# Patient Record
Sex: Female | Born: 2000 | Race: White | Hispanic: No | Marital: Single | Smoking: Never smoker
Health system: Southern US, Community
[De-identification: ages and names within clinical notes are randomized; demographics above are authoritative.]

## PROBLEM LIST (undated history)

## (undated) DIAGNOSIS — B084 Enteroviral vesicular stomatitis with exanthem: Secondary | ICD-10-CM

## (undated) DIAGNOSIS — Z23 Encounter for immunization: Secondary | ICD-10-CM

## (undated) HISTORY — DX: Enteroviral vesicular stomatitis with exanthem: B08.4

## (undated) HISTORY — PX: LIPOMA EXCISION: SHX5283

## (undated) HISTORY — DX: Encounter for immunization: Z23

## (undated) NOTE — *Deleted (*Deleted)
I value your feedback and entrusting us with your care. If you get a [facility] patient survey, I would appreciate you taking the time to let us know about your experience today. Thank you!  As of March 12, 2019, your lab results will be released to your MyChart immediately, before I even have a chance to see them. Please give me time to review them and contact you if there are any abnormalities. Thank you for your patience.  

---
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
## 2021-03-06 ENCOUNTER — Other Ambulatory Visit: Payer: Self-pay

## 2021-03-06 ENCOUNTER — Ambulatory Visit (INDEPENDENT_AMBULATORY_CARE_PROVIDER_SITE_OTHER): Payer: Medicaid Other

## 2021-03-06 DIAGNOSIS — Z3042 Encounter for surveillance of injectable contraceptive: Secondary | ICD-10-CM | POA: Diagnosis not present

## 2021-03-06 MED ORDER — MEDROXYPROGESTERONE ACETATE 150 MG/ML IM SUSP
150.0000 mg | Freq: Once | INTRAMUSCULAR | Status: AC
  Administered 2021-03-06: 150 mg via INTRAMUSCULAR

## 2021-06-20 ENCOUNTER — Telehealth: Payer: Self-pay | Admitting: Nurse Practitioner

## 2021-06-20 ENCOUNTER — Ambulatory Visit (INDEPENDENT_AMBULATORY_CARE_PROVIDER_SITE_OTHER): Payer: Medicaid Other | Admitting: Registered Nurse

## 2021-06-20 ENCOUNTER — Encounter: Payer: Self-pay | Admitting: Registered Nurse

## 2021-06-20 VITALS — BP 103/64 | HR 71 | Temp 98.1°F | Resp 18 | Ht 62.0 in | Wt 113.4 lb

## 2021-06-20 DIAGNOSIS — L989 Disorder of the skin and subcutaneous tissue, unspecified: Secondary | ICD-10-CM | POA: Diagnosis not present

## 2021-06-20 DIAGNOSIS — Z3042 Encounter for surveillance of injectable contraceptive: Secondary | ICD-10-CM

## 2021-06-20 LAB — POCT URINE PREGNANCY: Preg Test, Ur: NEGATIVE

## 2021-06-20 MED ORDER — TRIAMCINOLONE ACETONIDE 0.1 % EX CREA: 1.0000 "application " | TOPICAL_CREAM | Freq: Two times a day (BID) | CUTANEOUS | 0 refills | Status: DC

## 2021-06-20 MED ORDER — MEDROXYPROGESTERONE ACETATE 150 MG/ML IM SUSY
PREFILLED_SYRINGE | Freq: Once | INTRAMUSCULAR | Status: AC
  Administered 2021-06-20: 150 mg via INTRAMUSCULAR

## 2021-06-20 NOTE — Patient Instructions (Signed)
Ms. Erin Krause -  ? ?Great to meet you! ? ?Depo today. Next shot due between June 6 - June 20. ? ?See you for a physical in September. ? ?Call sooner with concerns ? ?Triamcinolone sent to pharmacy ? ?Thanks, ? ?Rich  ?

## 2021-06-20 NOTE — Telephone Encounter (Signed)
Patient not under care of [facility]. ? ?Please call patient and advise she can contact new PCP for this information. Thanks! ?

## 2021-06-20 NOTE — Telephone Encounter (Signed)
Patient called to request copy of immunization records for new job asap. ? ?Please advise at [phone].  ?

## 2021-06-20 NOTE — Progress Notes (Signed)
? ?New Patient Office Visit ? ?Subjective:  ?Patient ID: Erin Krause, female    DOB: June 23, 2000  Age: 21 y.o. MRN: [id number] ? ?CC:  ?Chief Complaint  ?Patient presents with  ? New Patient (Initial Visit)  ?  Patient states she is here to establish care and would like to get Depo. Patient states she wants skin tag looked at.  ? ? ?HPI ?Erin Krause presents for visit to est care, depo, skin tag ? ?Histories reviewed and updated with patient.  ? ?Has been on dmpa for some time ?Amenorrhea secondary to this ?Due in Feb, has not had upic since ?Would like to resume ?Tolerated well ?Brought inj from pharmacy today. ? ?Notes skin lesion behind L knee ?Ongoing for some time ?Recently became inflamed and itchy ?Has not treated ?Not growing or spreading ?No red streaks or distal swelling/redness ? ?Past Medical History:  ?Diagnosis Date  ? Hand, foot and mouth disease 12/01/2012  ? Vaccine for human papilloma virus (HPV) types 6, 11, 16, and 18 administered   ? ? ?Past Surgical History:  ?Procedure Laterality Date  ? LIPOMA EXCISION    ? ? ?Family History  ?Problem Relation Age of Onset  ? Heart attack Maternal Grandmother   ? Heart attack Maternal Grandfather   ? ? ?Social History  ? ?Socioeconomic History  ? Marital status: Single  ?  Spouse name: Not on file  ? Number of children: Not on file  ? Years of education: Not on file  ? Highest education level: Not on file  ?Occupational History  ? Not on file  ?Tobacco Use  ? Smoking status: Never  ? Smokeless tobacco: Never  ?Vaping Use  ? Vaping Use: Never used  ?Substance and Sexual Activity  ? Alcohol use: Never  ? Drug use: Never  ? Sexual activity: Yes  ?  Partners: Male  ?  Birth control/protection: Condom, Injection  ?Other Topics Concern  ? Not on file  ?Social History Narrative  ? Not on file  ? ?Social Determinants of Health  ? ?Financial Resource Strain: Not on file  ?Food Insecurity: Not on file  ?Transportation Needs: Not on file  ?Physical  Activity: Not on file  ?Stress: Not on file  ?Social Connections: Not on file  ?Intimate Partner Violence: Not on file  ? ? ?ROS ?Review of Systems  ?Constitutional: Negative.   ?HENT: Negative.    ?Eyes: Negative.   ?Respiratory: Negative.    ?Cardiovascular: Negative.   ?Gastrointestinal: Negative.   ?Genitourinary: Negative.   ?Musculoskeletal: Negative.   ?Skin: Negative.   ?Neurological: Negative.   ?Psychiatric/Behavioral: Negative.    ?All other systems reviewed and are negative. ? ?Objective:  ? ?Today's Vitals: BP 103/64   Pulse 71   Temp 98.1 ?F (36.7 ?C) (Temporal)   Resp 18   Ht '5\' 2"'$  (1.575 m)   Wt 113 lb 6.4 oz (51.4 kg)   SpO2 100%   BMI 20.74 kg/m?  ? ?Physical Exam ?Vitals and nursing note reviewed.  ?Constitutional:   ?   General: She is not in acute distress. ?   Appearance: Normal appearance. She is not ill-appearing, toxic-appearing or diaphoretic.  ?Cardiovascular:  ?   Rate and Rhythm: Normal rate and regular rhythm.  ?   Pulses: Normal pulses.  ?   Heart sounds: Normal heart sounds. No murmur heard. ?  No friction rub. No gallop.  ?Pulmonary:  ?   Effort: Pulmonary effort is normal. No respiratory distress.  ?  Breath sounds: Normal breath sounds. No stridor. No wheezing, rhonchi or rales.  ?Chest:  ?   Chest wall: No tenderness.  ?Skin: ?   General: Skin is warm and dry.  ?   Capillary Refill: Capillary refill takes less than 2 seconds.  ?   Findings: Lesion (behind L knee. mild swelling, raised 1-61m. some redness. nontender. excoriated in center.) present.  ?Neurological:  ?   General: No focal deficit present.  ?   Mental Status: She is alert and oriented to person, place, and time. Mental status is at baseline.  ?Psychiatric:     ?   Mood and Affect: Mood normal.     ?   Behavior: Behavior normal.     ?   Thought Content: Thought content normal.     ?   Judgment: Judgment normal.  ? ? ?Assessment & Plan:  ? ?Problem List Items Addressed This Visit   ?None ?Visit Diagnoses   ? ?  Encounter for surveillance of injectable contraceptive    -  Primary  ? Relevant Orders  ? POCT urine pregnancy (Completed)  ? Skin lesion of left leg      ? Relevant Medications  ? triamcinolone cream (KENALOG) 0.1 %  ? ?  ? ? ?Outpatient Encounter Medications as of 06/20/2021  ?Medication Sig  ? medroxyPROGESTERone Acetate 150 MG/ML SUSY Inject 1 mL (150 mg total) into the muscle every 3 (three) months.  ? triamcinolone cream (KENALOG) 0.1 % Apply 1 application. topically 2 (two) times daily.  ? clindamycin (CLEOCIN T) 1 % external solution Apply small amount to entire face every morning. Need appt for further refills (Patient not taking: Reported on 06/20/2021)  ? DIFFERIN 0.3 % gel APPLY 1 A SMALL AMOUNT TO AFFECTED AREA EVERY EVENING (Patient not taking: Reported on 06/20/2021)  ? ?No facility-administered encounter medications on file as of 06/20/2021.  ? ? ?Follow-up: Return in about 3 months (around 09/20/2021) for Depo June 6-20, CPE in Sept.  ? ?PLAN ?Negative pregnancy test today. No upic since last dmpa was due. Will resume today. Advised condoms or abstinence for one week after injection. Return for next shot in window. ?Triamcinolone for skin lesion. Unclear etiology. Will continue to monitor as it heals up.  ?Patient encouraged to call clinic with any questions, comments, or concerns. ? ?RMaximiano Coss NP ? ?

## 2021-06-21 ENCOUNTER — Telehealth: Payer: Self-pay

## 2021-06-21 NOTE — Telephone Encounter (Signed)
Patient's PCP was changed to Maximiano Coss after I took the message. Outbound call placed to notify patient; she will contact her new PCP for records. ? ? ?

## 2021-06-21 NOTE — Telephone Encounter (Signed)
Faxed

## 2021-06-21 NOTE — Telephone Encounter (Signed)
Pt is wanting shot records for job faxed to [phone]  ?

## 2021-09-25 ENCOUNTER — Encounter: Payer: Self-pay | Admitting: Family Medicine

## 2021-09-25 ENCOUNTER — Other Ambulatory Visit: Payer: Self-pay

## 2021-09-25 ENCOUNTER — Ambulatory Visit (INDEPENDENT_AMBULATORY_CARE_PROVIDER_SITE_OTHER): Payer: Medicaid Other | Admitting: Family Medicine

## 2021-09-25 VITALS — HR 76 | Temp 97.6°F | Resp 18 | Ht 63.0 in | Wt 110.4 lb

## 2021-09-25 DIAGNOSIS — N898 Other specified noninflammatory disorders of vagina: Secondary | ICD-10-CM

## 2021-10-24 ENCOUNTER — Ambulatory Visit: Payer: Medicaid Other | Admitting: Dermatology

## 2021-10-25 ENCOUNTER — Ambulatory Visit (INDEPENDENT_AMBULATORY_CARE_PROVIDER_SITE_OTHER): Payer: Medicaid Other | Admitting: Dermatology

## 2021-10-25 DIAGNOSIS — L732 Hidradenitis suppurativa: Secondary | ICD-10-CM | POA: Diagnosis not present

## 2021-10-25 DIAGNOSIS — L720 Epidermal cyst: Secondary | ICD-10-CM | POA: Diagnosis not present

## 2021-10-25 DIAGNOSIS — B070 Plantar wart: Secondary | ICD-10-CM

## 2021-10-25 MED ORDER — CLINDAMYCIN PHOSPHATE 1 % EX LOTN: TOPICAL_LOTION | Freq: Every day | CUTANEOUS | 5 refills | Status: AC

## 2021-10-25 NOTE — Progress Notes (Signed)
Follow-Up Visit   Subjective  Erin Krause is a 21 y.o. female who presents for the following: Warts (Mother and patient report possible wart at left foot. ) and Skin Problem (Patient reports possible cyst in right vaginal area. Has been seen by primary care. PCP drained but told patient if cyst came back to see dermatology. ).  The following portions of the chart were reviewed this encounter and updated as appropriate:  Tobacco  Allergies  Meds  Problems  Med Hx  Surg Hx  Fam Hx      Review of Systems: No other skin or systemic complaints except as noted in HPI or Assessment and Plan.   Objective  Well appearing patient in no apparent distress; mood and affect are within normal limits.  A focused examination was performed including left foot, right labia majora . Relevant physical exam findings are noted in the Assessment and Plan.  right labia majora Small tender erythematous fluctuant linear plaque. Multiple double open comedones and small scars at labia majora   left plantar foot x 1 Verrucous papules -- Discussed viral etiology and contagion.   Right Labium Majus Small tender erythematous fluctuant linear plaque.    Assessment & Plan  Hidradenitis suppurativa right labia majora  Chronic and persistent condition with duration or expected duration over one year. Condition is bothersome/symptomatic for patient. Currently flared.  Hidradenitis Suppurativa is a chronic; persistent; non-curable, but treatable condition due to abnormal inflamed sweat glands in the body folds (axilla, inframammary, groin, medial thighs), causing recurrent painful draining cysts and scarring. It can be associated with severe scarring acne and cysts; also abscesses and scarring of scalp. The goal is control and prevention of flares, as it is not curable. Scars are permanent and can be thickened. Treatment may include daily use of topical medication and oral antibiotics.  Oral isotretinoin  may also be helpful.  For more severe cases, Humira (a biologic injection) may be prescribed to decrease the inflammatory process and prevent flares.  When indicated, inflamed cysts may also be treated surgically.  Recommend Start clindamycin solution in the am to the areas where you tend to get bumps. Avoid getting in the vagina.  Recommend washing the area in the shower once a day with Benzoyl Peroxide wash such as PanOyxl 4% creamy wash or hibiclens (chlorhexidine) wash. Avoid getting either of the washes in the vaginal area as they can cause yeast infections.   Benzoyl peroxide can cause dryness and irritation of the skin. It can also bleach fabric.   If cyst still bothering recommend HS surgery    clindamycin (CLEOCIN-T) 1 % lotion - right labia majora Apply topically daily. To areas where you tend to get bumps  Anaerobic and Aerobic Culture - right labia majora  Plantar wart left plantar foot x 1  Discussed viral etiology and risk of spread.  Discussed multiple treatments may be required to clear warts.  Discussed possible post-treatment dyspigmentation and risk of recurrence.  Prior to procedure, discussed risks of blister formation, small wound, skin dyspigmentation, or rare scar following cryotherapy. Recommend Vaseline ointment to treated areas while healing.  Squaric Acid 3% applied to warts today. Prior to application reviewed risk of inflammation and irritation.  Cantharidin Plus is a blistering agent that comes from a beetle.  It needs to be washed off in about 4 hours after application.  Although it is painless when applied in office, it may cause symptoms of mild pain and burning several hours later.  Treated areas will swell and turn red, and blisters may form.  Vaseline and a bandaid may be applied until wound has healed.  Once healed, the skin may remain temporarily discolored.  It can take weeks to months for pigmentation to return to normal.  Advised to wash off with  soap and water in 4 hours or sooner if it becomes tender before then.   Destruction of lesion - left plantar foot x 1  Destruction method: cryotherapy   Informed consent: discussed and consent obtained   Lesion destroyed using liquid nitrogen: Yes   Cryotherapy cycles:  2 Outcome: patient tolerated procedure well with no complications   Post-procedure details: wound care instructions given    Destruction of lesion - left plantar foot x 1  Destruction method: chemical removal   Informed consent: discussed and consent obtained   Timeout:  patient name, date of birth, surgical site, and procedure verified Chemical destruction method: cantharidin   Chemical destruction method comment:  Plus Application time:  4 hours Procedure instructions: patient instructed to wash and dry area   Outcome: patient tolerated procedure well with no complications   Post-procedure details: wound care instructions given    Epidermal inclusion cyst Right Labium Majus  Favor secondary to HS Culture today to r/o infection ILK injection done today NDC 6384-5364-68  Kenalog '10mg'$ /cc  May consider HS surgery if continues to be symptomatic despite injections  Intralesional injection - Right Labium Majus Location: right labia majora   Informed Consent: Discussed risks (infection, pain, bleeding, bruising, thinning of the skin, loss of skin pigment, lack of resolution, and recurrence of lesion) and benefits of the procedure, as well as the alternatives. Informed consent was obtained. Preparation: The area was prepared a standard fashion.  Procedure Details: An intralesional injection was performed with Kenalog 10 mg/cc. 0.15 cc in total were injected.  Total number of injections:2  Plan: The patient was instructed on post-op care. Recommend OTC analgesia as needed for pain.   Return for 4 - 8 week follow up wart and HS . I, Ruthell Rummage, CMA, am acting as scribe for [name],  MD.  Documentation: I have reviewed the above documentation for accuracy and completeness, and I agree with the above.  [name], MD

## 2021-10-25 NOTE — Patient Instructions (Addendum)
Hidradenitis Suppurativa is a chronic; persistent; non-curable, but treatable condition due to abnormal inflamed sweat glands in the body folds (axilla, inframammary, groin, medial thighs), causing recurrent painful draining cysts and scarring. It can be associated with severe scarring acne and cysts; also abscesses and scarring of scalp. The goal is control and prevention of flares, as it is not curable. Scars are permanent and can be thickened. Treatment may include daily use of topical medication and oral antibiotics.  Oral isotretinoin may also be helpful.  When indicated, inflamed cysts may also be treated surgically.  Recommend Start clindamycin solution in the am to the areas where you tend to get bumps. Avoid getting in the vagina.  Recommend washing the area in the shower once a day with Benzoyl Peroxide wash such as PanOyxl 4% creamy wash or hibiclens (chlorhexidine) wash. Avoid getting either of the washes in the vaginal area as they can cause yeast infections.   Benzoyl peroxide can cause dryness and irritation of the skin. It can also bleach fabric.    Cantharidin Plus is a blistering agent that comes from a beetle.  It needs to be washed off in about 4 hours after application.  Although it is painless when applied in office, it may cause symptoms of mild pain and burning several hours later.  Treated areas will swell and turn red, and blisters may form.  Vaseline and a bandaid may be applied until wound has healed.  Once healed, the skin may remain temporarily discolored.  It can take weeks to months for pigmentation to return to normal.  Advised to wash off with soap and water in 4 hours or sooner if it becomes tender before then.   Intralesional steroid injection side effects were reviewed including thinning of the skin and discoloration, such as redness, lightening or darkening.   Recommend taking Heliocare sun protection supplement daily in sunny weather for additional sun protection.  For maximum protection on the sunniest days, you can take up to 2 capsules of regular Heliocare OR take 1 capsule of Heliocare Ultra. For prolonged exposure (such as a full day in the sun), you can repeat your dose of the supplement 4 hours after your first dose. Heliocare can be purchased at [employer], at some [facility] or at [email or web address].     Some Self-Tanner Suggestions include - Sublime wipes by [place]  Melanoma is the most dangerous type of skin cancer, and is the leading cause of death from skin disease.  You are more likely to develop melanoma if you: Have light-colored skin, light-colored eyes, or red or blond hair Spend a lot of time in the sun Tan regularly, either outdoors or in a tanning bed Have had blistering sunburns, especially during childhood Have a close family member who has had a melanoma Have atypical moles or large birthmarks  Early detection of melanoma is key since treatment is typically straightforward and cure rates are extremely high if we catch it early.   The first sign of melanoma is often a change in a mole or a new dark spot.  The ABCDE system is a way of remembering the signs of melanoma.  A for asymmetry:  The two halves do not match. B for border:  The edges of the growth are irregular. C for color:  A mixture of colors are present instead of an even brown color. D for diameter:  Melanomas are usually (but not always) greater than  48m - the size of a pencil eraser. E for evolution:  The spot keeps changing in size, shape, and color.  Please check your skin once per month between visits. You can use a small mirror in front and a large mirror behind you to keep an eye on the back side or your body.   If you see any new or changing lesions before your next follow-up, please call to schedule a visit.  Please continue daily skin protection including broad spectrum sunscreen SPF 30+ to  sun-exposed areas, reapplying every 2 hours as needed when you're outdoors.         Due to recent changes in healthcare laws, you may see results of your pathology and/or laboratory studies on MyChart before the doctors have had a chance to review them. We understand that in some cases there may be results that are confusing or concerning to you. Please understand that not all results are received at the same time and often the doctors may need to interpret multiple results in order to provide you with the best plan of care or course of treatment. Therefore, we ask that you please give u[place]2 business days to thoroughly review all your results before contacting the office for clarification. Should we see a critical lab result, you will be contacted sooner.   If You Need Anything After Your Visit  If you have any questions or concerns for your doctor, please call our main line at [phone] press option 4 to reach your doctor's medical assistant. If no one answers, please leave a voicemail as directed and we will return your call as soon as possible. Messages left after 4 pm will be answered the following business day.   You may also send [place] message via [place] We typically respond to MyChart messages within 1-2 business days.  For prescription refills, please ask your pharmacy to contact our office. Our fax number is [phone]  If you have an urgent issue when the clinic is closed that cannot wait until the next business day, you can page your doctor at the number below.    Please note that while we do our best to be available for urgent issues outside of office hours, we are not available 24/7.   If you have an urgent issue and are unable to reach [place] you may choose to seek medical care at your doctor's office, retail clinic, urgent care center, or emergency room.  If you have a medical emergency, please immediately call 911 or go to the emergency department.  Pager Numbers  -  Dr. KNehemiah Massed [phone] - Dr. MLaurence Ferrari [phone] - Dr. SNicole Kindred [phone] In the event of inclement weather, please call our main line at [phone] an update on the status of any delays or closures.  Dermatology Medication Tips: Please keep the boxes that topical medications come in in order to help keep track of the instructions about where and how to use these. Pharmacies typically print the medication instructions only on the boxes and not directly on the medication tubes.   If your medication is too expensive, please contact our office at [phone] 4 or send [place] message through [place]   We are unable to tell what your co-pay for medications will be in advance as this is different depending on your insurance coverage. However, we may be able to find a substitute medication at lower cost or fill out paperwork to get insurance to cover a  needed medication.   If a prior authorization is required to get your medication covered by your insurance company, please allow [place] 1-2 business days to complete this process.  Drug prices often vary depending on where the prescription is filled and some pharmacies may offer cheaper prices.  The website www.goodrx.com contains coupons for medications through different pharmacies. The prices here do not account for what the cost may be with help from insurance (it may be cheaper with your insurance), but the website can give you the price if you did not use any insurance.  - You can print the associated coupon and take it with your prescription to the pharmacy.  - You may also stop by our office during regular business hours and pick up a GoodRx coupon card.  - If you need your prescription sent electronically to a different pharmacy, notify our office through [facility] or by phone at [phone] option 4.     Si Usted Necesita Algo Despus de Su Visita  Tambin puede enviarnos un mensaje a travs de Pharmacist, community. Por  lo general respondemos a los mensajes de MyChart en el transcurso de 1 a 2 das hbiles.  Para renovar recetas, por favor pida a su farmacia que se ponga en contacto con nuestra oficina. Harland Dingwall de fax es [place] [phone].  Si tiene un asunto urgente cuando la clnica est cerrada y que no puede esperar hasta el siguiente da hbil, puede llamar/localizar a su doctor(a) al nmero que aparece a continuacin.   Por favor, tenga en cuenta que aunque hacemos todo lo posible para estar disponibles para asuntos urgentes fuera del horario de [place], no estamos disponibles las 24 horas del da, los 7 das de la [place].   Si tiene un problema urgente y no puede comunicarse con nosotros, puede optar por buscar atencin mdica  en el consultorio de su doctor(a), en una clnica privada, en un centro de atencin urgente o en una sala de emergencias.  Si tiene Engineering geologist, por favor llame inmediatamente al 911 o vaya a la sala de emergencias.  Nmeros de bper  - Dr. Nehemiah Massed: [phone]  - Dra. Moye: [phone]  - Dra. Nicole Kindred: [phone]  En caso de inclemencias del [place], por favor llame a Johnsie Kindred principal al [phone] para una actualizacin sobre el [place] de cualquier retraso o cierre.  Consejos para la medicacin en dermatologa: Por favor, guarde las cajas en las que vienen los medicamentos de uso tpico para ayudarle a seguir las instrucciones sobre dnde y cmo usarlos. Las farmacias generalmente imprimen las instrucciones del medicamento slo en las cajas y no directamente en los tubos del [place].   Si su medicamento es muy caro, por favor, pngase en contacto con Zigmund Daniel llamando al [phone] y presione la opcin 4 o envenos un mensaje a travs de Pharmacist, community.   No podemos decirle cul ser su copago por los medicamentos por adelantado ya que esto es diferente dependiendo de la cobertura de su seguro. Sin embargo, es posible que podamos encontrar  un medicamento sustituto a Electrical engineer un formulario para que el seguro cubra el medicamento que se considera necesario.   Si se requiere una autorizacin previa para que su compaa de seguros [place] su medicamento, por favor permtanos de 1 a 2 das hbiles para completar este proceso.  Los precios de los medicamentos varan con frecuencia dependiendo del Environmental consultant de dnde se surte la receta y alguna farmacias pueden ofrecer precios ms baratos.  El sitio web www.goodrx.com tiene cupones para medicamentos de Airline pilot. Los precios aqu no tienen en cuenta lo que podra costar con la ayuda del seguro (puede ser ms barato con su seguro), pero el sitio web puede darle el precio si no utiliz Research scientist (physical sciences).  - Puede imprimir el cupn correspondiente y llevarlo con su receta a la farmacia.  - Tambin puede pasar por nuestra oficina durante el horario de atencin regular y Charity fundraiser una tarjeta de cupones de GoodRx.  - Si necesita que su receta se enve electrnicamente a una farmacia diferente, informe a nuestra oficina a travs de MyChart de [place] o por telfono llamando al [phone] y presione la opcin 4.

## 2021-10-30 ENCOUNTER — Encounter: Payer: Self-pay | Admitting: Dermatology

## 2021-11-02 ENCOUNTER — Telehealth: Payer: Self-pay

## 2021-11-02 LAB — ANAEROBIC AND AEROBIC CULTURE

## 2021-11-02 NOTE — Telephone Encounter (Signed)
-----   Message from Alfonso Patten, MD sent at 11/02/2021 10:09 AM EDT ----- Normal bacteria for this area. No antibiotics needed. Please check on how she is doing since the drainage. Thank you!  MAs please call. Thank you!

## 2021-11-02 NOTE — Telephone Encounter (Signed)
Patient's mother advised culture showed normal bacteria, no antibiotics needed. Patient's mother said that the cyst has come back but patient is using clindamycin solution and has follow up appt to recheck.  Lurlean Horns., RMA

## 2021-11-21 ENCOUNTER — Encounter: Payer: Self-pay | Admitting: Dermatology

## 2021-11-21 ENCOUNTER — Ambulatory Visit (INDEPENDENT_AMBULATORY_CARE_PROVIDER_SITE_OTHER): Payer: Medicaid Other | Admitting: Dermatology

## 2021-11-21 DIAGNOSIS — B070 Plantar wart: Secondary | ICD-10-CM

## 2021-11-21 DIAGNOSIS — L732 Hidradenitis suppurativa: Secondary | ICD-10-CM | POA: Diagnosis not present

## 2021-11-21 NOTE — Patient Instructions (Signed)
Cryotherapy Aftercare  Wash gently with soap and water everyday.   Apply Vaseline and Band-Aid daily until healed.    Instructions for After In-Office Application of Cantharidin  1. This is a strong medicine; please follow ALL instructions.  2. Gently wash off with soap and water in four hours or sooner s directed by your physician.  3. **WARNING** this medicine can cause severe blistering, blood blisters, infection, and/or scarring if it is not washed off as directed.  4. Your progress will be rechecked in 1-2 months; call sooner if there are any questions or problems.   Cantharidin is a blistering agent that comes from a beetle.  It needs to be washed off in about 4 hours after application.  Although it is painless when applied in office, it may cause symptoms of mild pain and burning several hours later.  Treated areas will swell and turn red, and blisters may form.  Vaseline and a bandaid may be applied until wound has healed.  Once healed, the skin may remain temporarily discolored.  It can take weeks to months for pigmentation to return to normal.  The molluscum may resolve with this topical treatment, but often, additional treatments may be required to clear molluscum.  It is recommended to keep the skin well-moisturized and avoid scratching affected area to help prevent spread of the molluscum.    Due to recent changes in healthcare laws, you may see results of your pathology and/or laboratory studies on MyChart before the doctors have had a chance to review them. We understand that in some cases there may be results that are confusing or concerning to you. Please understand that not all results are received at the same time and often the doctors may need to interpret multiple results in order to provide you with the best plan of care or course of treatment. Therefore, we ask that you please give [place] 2 business days to thoroughly review all your results before contacting the office for  clarification. Should we see a critical lab result, you will be contacted sooner.   If You Need Anything After Your Visit  If you have any questions or concerns for your doctor, please call our main line at [phone] and press option 4 to reach your doctor's medical assistant. If no one answers, please leave a voicemail as directed and we will return your call as soon as possible. Messages left after 4 pm will be answered the following business day.   You may also send [place] a message via [place]. We typically respond to MyChart messages within 1-2 business days.  For prescription refills, please ask your pharmacy to contact our office. Our fax number is [phone].  If you have an urgent issue when the clinic is closed that cannot wait until the next business day, you can page your doctor at the number below.    Please note that while we do our best to be available for urgent issues outside of office hours, we are not available 24/7.   If you have an urgent issue and are unable to reach [place], you may choose to seek medical care at your doctor's office, retail clinic, urgent care center, or emergency room.  If you have a medical emergency, please immediately call 911 or go to the emergency department.  Pager Numbers  - Dr. Nehemiah Massed: [phone]  - Dr. Laurence Ferrari: [phone]  - Dr. Nicole Kindred: [phone]  In the event of inclement weather, please call our main line at [phone] for an  update on the status of any delays or closures.  Dermatology Medication Tips: Please keep the boxes that topical medications come in in order to help keep track of the instructions about where and how to use these. Pharmacies typically print the medication instructions only on the boxes and not directly on the medication tubes.   If your medication is too expensive, please contact our office at [phone] option 4 or send [place] a message through [place].   We are unable to tell what your co-pay for  medications will be in advance as this is different depending on your insurance coverage. However, we may be able to find a substitute medication at lower cost or fill out paperwork to get insurance to cover a needed medication.   If a prior authorization is required to get your medication covered by your insurance company, please allow [place] 1-2 business days to complete this process.  Drug prices often vary depending on where the prescription is filled and some pharmacies may offer cheaper prices.  The website www.goodrx.com contains coupons for medications through different pharmacies. The prices here do not account for what the cost may be with help from insurance (it may be cheaper with your insurance), but the website can give you the price if you did not use any insurance.  - You can print the associated coupon and take it with your prescription to the pharmacy.  - You may also stop by our office during regular business hours and pick up a GoodRx coupon card.  - If you need your prescription sent electronically to a different pharmacy, notify our office through [facility] or by phone at [phone] option 4.     Si Usted Necesita Algo Despus de Su Visita  Tambin puede enviarnos un mensaje a travs de Pharmacist, community. Por lo general respondemos a los mensajes de MyChart en el transcurso de 1 a 2 das hbiles.  Para renovar recetas, por favor pida a su farmacia que se ponga en contacto con nuestra oficina. Harland Dingwall de fax es [place] [phone].  Si tiene un asunto urgente cuando la clnica est cerrada y que no puede esperar hasta el siguiente da hbil, puede llamar/localizar a su doctor(a) al nmero que aparece a continuacin.   Por favor, tenga en cuenta que aunque hacemos todo lo posible para estar disponibles para asuntos urgentes fuera del horario de [place], no estamos disponibles las 24 horas del da, los 7 das de la [place].   Si tiene un problema urgente y no puede  comunicarse con nosotros, puede optar por buscar atencin mdica  en el consultorio de su doctor(a), en una clnica privada, en un centro de atencin urgente o en una sala de emergencias.  Si tiene Engineering geologist, por favor llame inmediatamente al 911 o vaya a la sala de emergencias.  Nmeros de bper  - Dr. Nehemiah Massed: [phone]  - Dra. Moye: [phone]  - Dra. Nicole Kindred: [phone]  En caso de inclemencias del [place], por favor llame a Johnsie Kindred principal al [phone] para una actualizacin sobre el [place] de cualquier retraso o cierre.  Consejos para la medicacin en dermatologa: Por favor, guarde las cajas en las que vienen los medicamentos de uso tpico para ayudarle a seguir las instrucciones sobre dnde y cmo usarlos. Las farmacias generalmente imprimen las instrucciones del medicamento slo en las cajas y no directamente en los tubos del [place].   Si su medicamento es [employer], por favor, pngase en contacto con nuestra oficina llamando  al [phone] y presione la opcin 4 o envenos un mensaje a travs de Pharmacist, community.   No podemos decirle cul ser su copago por los medicamentos por adelantado ya que esto es diferente dependiendo de la cobertura de su seguro. Sin embargo, es posible que podamos encontrar un medicamento sustituto a Electrical engineer un formulario para que el seguro cubra el medicamento que se considera necesario.   Si se requiere una autorizacin previa para que su compaa de seguros [place] su medicamento, por favor permtanos de 1 a 2 das hbiles para completar este proceso.  Los precios de los medicamentos varan con frecuencia dependiendo del Environmental consultant de dnde se surte la receta y alguna farmacias pueden ofrecer precios ms baratos.  El sitio web www.goodrx.com tiene cupones para medicamentos de Airline pilot. Los precios aqu no tienen en cuenta lo que podra costar con la ayuda del seguro (puede ser ms barato con su seguro), pero  el sitio web puede darle el precio si no utiliz Research scientist (physical sciences).  - Puede imprimir el cupn correspondiente y llevarlo con su receta a la farmacia.  - Tambin puede pasar por nuestra oficina durante el horario de atencin regular y Charity fundraiser una tarjeta de cupones de GoodRx.  - Si necesita que su receta se enve electrnicamente a una farmacia diferente, informe a nuestra oficina a travs de MyChart de [place] o por telfono llamando al [phone] y presione la opcin 4.

## 2021-11-21 NOTE — Progress Notes (Signed)
Follow-Up Visit   Subjective  Erin Krause is a 21 y.o. female who presents for the following: Warts (Left plantar foot. Tx with squaric acid, cantharone plus and cryotherapy at last visit. Patient thinks area is smaller) and Cyst (Recheck vaginal area. Hx of cyst, injected with ILK at last visit. Has been using Clindamycin solution and BPO wash).  The following portions of the chart were reviewed this encounter and updated as appropriate:  Tobacco  Allergies  Meds  Problems  Med Hx  Surg Hx  Fam Hx      Review of Systems: No other skin or systemic complaints except as noted in HPI or Assessment and Plan.   Objective  Well appearing patient in no apparent distress; mood and affect are within normal limits.  A focused examination was performed including groin, left foot. Relevant physical exam findings are noted in the Assessment and Plan.  left plantar foot x1 Verrucous papules -- Discussed viral etiology and contagion.    Assessment & Plan  Plantar wart left plantar foot x1  Discussed viral etiology and risk of spread.  Discussed multiple treatments may be required to clear warts.  Discussed possible post-treatment dyspigmentation and risk of recurrence.  Cantharidin Plus is a blistering agent that comes from a beetle.  It needs to be washed off in about 4 hours after application.  Although it is painless when applied in office, it may cause symptoms of mild pain and burning several hours later.  Treated areas will swell and turn red, and blisters may form.  Vaseline and a bandaid may be applied until wound has healed.  Once healed, the skin may remain temporarily discolored.  It can take weeks to months for pigmentation to return to normal.  Advised to wash off with soap and water in 4 hours or sooner if it becomes tender before then.   Destruction of lesion - left plantar foot x1  Destruction method: cryotherapy   Informed consent: discussed and consent obtained    Lesion destroyed using liquid nitrogen: Yes   Outcome: patient tolerated procedure well with no complications   Post-procedure details: wound care instructions given   Additional details:  Prior to procedure, discussed risks of blister formation, small wound, skin dyspigmentation, or rare scar following cryotherapy. Recommend Vaseline ointment to treated areas while healing.   Destruction of lesion - left plantar foot x1  Destruction method: chemical removal   Informed consent: discussed and consent obtained   Timeout:  patient name, date of birth, surgical site, and procedure verified Chemical destruction method comment:  Cantharidin plus Procedure instructions: patient instructed to wash and dry area   Procedure instructions comment:  Wash off in 4-6 hours Outcome: patient tolerated procedure well with no complications   Post-procedure details: wound care instructions given   Additional details:  Squaric Acid 3% applied to warts today. Prior to application reviewed risk of inflammation and irritation.   Hidradenitis suppurativa Pubic  Chronic condition with duration or expected duration over one year. Currently well-controlled.    Continue Clindamycin lotion as directed.  Use Benzoyl peroxide cleanser as directed.   Related Medications clindamycin (CLEOCIN-T) 1 % lotion Apply topically daily. To areas where you tend to get bumps   Return in about 4 weeks (around 12/19/2021) for Wart Follow UP.  I, Erin Krause, CMA, am acting as scribe for Erin Gleason, MD.  Documentation: I have reviewed the above documentation for accuracy and completeness, and I agree with the above.  Erin  Laurence Ferrari, MD

## 2021-11-24 ENCOUNTER — Ambulatory Visit: Payer: Medicaid Other | Admitting: Family Medicine

## 2021-11-26 ENCOUNTER — Other Ambulatory Visit: Payer: Self-pay | Admitting: Nurse Practitioner

## 2021-11-26 DIAGNOSIS — Z3042 Encounter for surveillance of injectable contraceptive: Secondary | ICD-10-CM

## 2021-11-27 ENCOUNTER — Encounter: Payer: Self-pay | Admitting: Dermatology

## 2021-11-29 ENCOUNTER — Telehealth: Payer: Self-pay | Admitting: Registered Nurse

## 2021-11-29 ENCOUNTER — Other Ambulatory Visit: Payer: Self-pay

## 2021-11-29 DIAGNOSIS — Z3042 Encounter for surveillance of injectable contraceptive: Secondary | ICD-10-CM

## 2021-11-29 MED ORDER — MEDROXYPROGESTERONE ACETATE 150 MG/ML IM SUSY: 150.0000 mg | PREFILLED_SYRINGE | INTRAMUSCULAR | 3 refills | Status: DC

## 2021-11-29 NOTE — Telephone Encounter (Signed)
Spoke w/ pt and I ask did she get a Depo injection any where in June because her last one was in March 2023. She stated no . I advised her to keep her apt w/ Dr Birdie Riddle on Friday 12/01/21 and as long as her pregnancy test is negative she can have the injection and she will be given a date to return for her next injection

## 2021-11-29 NOTE — Telephone Encounter (Addendum)
Caller name: Angelea Penny   On DPR? :yes/no: Yes  Call back number: [phone]  Provider they see:  Birdie Riddle   Reason for call:  Pt called to see if her Depo shot got sent in to her pharmacy. Pt pharmacy is [id number]- [place], [place]

## 2021-12-01 ENCOUNTER — Encounter: Payer: Self-pay | Admitting: Family Medicine

## 2021-12-01 ENCOUNTER — Ambulatory Visit (INDEPENDENT_AMBULATORY_CARE_PROVIDER_SITE_OTHER): Payer: Medicaid Other | Admitting: Family Medicine

## 2021-12-01 VITALS — BP 112/80 | HR 74 | Temp 98.0°F | Resp 16 | Ht 63.0 in | Wt 113.0 lb

## 2021-12-01 DIAGNOSIS — Z3042 Encounter for surveillance of injectable contraceptive: Secondary | ICD-10-CM

## 2021-12-01 DIAGNOSIS — Z308 Encounter for other contraceptive management: Secondary | ICD-10-CM | POA: Diagnosis not present

## 2021-12-01 DIAGNOSIS — R55 Syncope and collapse: Secondary | ICD-10-CM | POA: Diagnosis not present

## 2021-12-01 DIAGNOSIS — Z309 Encounter for contraceptive management, unspecified: Secondary | ICD-10-CM | POA: Insufficient documentation

## 2021-12-01 LAB — POCT URINE PREGNANCY: Preg Test, Ur: NEGATIVE

## 2021-12-01 MED ORDER — MEDROXYPROGESTERONE ACETATE 150 MG/ML IM SUSP
150.0000 mg | Freq: Once | INTRAMUSCULAR | Status: AC
  Administered 2021-12-01: 150 mg via INTRAMUSCULAR

## 2021-12-01 NOTE — Progress Notes (Signed)
   Subjective:    Patient ID: Erin Krause, female    DOB: [birth year], 21 y.o.   MRN: [id number]  HPI Contraception counseling- pt had Depo injxn in March but did not get shot in June.  Here today to resume injxns.  Pt is aware that Depo is q3 months.  Will make sure she has a scheduling window prior to leaving.  Pt reports she has been tolerating medication w/o difficulty.  Has not had menses since starting injxns 3 yrs ago.  Not taking any MVI or supplements.     Review of Systems For ROS see HPI     Objective:   Physical Exam Vitals reviewed.  Constitutional:      General: She is not in acute distress.    Appearance: Normal appearance. She is not ill-appearing.  HENT:     Head: Normocephalic and atraumatic.  Eyes:     Extraocular Movements: Extraocular movements intact.     Conjunctiva/sclera: Conjunctivae normal.     Pupils: Pupils are equal, round, and reactive to light.  Skin:    General: Skin is warm and dry.  Neurological:     General: No focal deficit present.     Mental Status: She is alert and oriented to person, place, and time.  Psychiatric:        Mood and Affect: Mood normal.        Behavior: Behavior normal.        Thought Content: Thought content normal.           Assessment & Plan:   Contraception management- new.  Pt forgot to schedule her last shot so pregnancy test required today.  Thankfully negative.  Injection given today along w/ instructions for scheduling next depo injxn.  Discussed need for daily MVI or Ca + D supplement to protect bones.  Vagal reaction- after receiving Depo shot, pt got pale and dizzy.  BP dropped to 92/60 and HR 100.  We had pt lie down, drink water, eat a small snack, and apply a cool compress to forehead.  Sxs quickly improved- BP rebounded to 100/72 and she felt much better.

## 2021-12-01 NOTE — Patient Instructions (Signed)
Follow up between 11/17-12/1 to get your next Depo injection ADD a daily multivitamin or Calcium + D supplement to make sure we're protecting your bones Call with any questions or concerns Happy Labor Day!!

## 2021-12-21 ENCOUNTER — Ambulatory Visit (INDEPENDENT_AMBULATORY_CARE_PROVIDER_SITE_OTHER): Payer: Medicaid Other | Admitting: Dermatology

## 2021-12-21 DIAGNOSIS — B070 Plantar wart: Secondary | ICD-10-CM

## 2021-12-21 NOTE — Progress Notes (Signed)
   Follow-Up Visit   Subjective  Erin Krause is a 21 y.o. female who presents for the following: Warts (Left plantar foot, tx'd with LN2, cantharidin plus, squaric acid 3%. Patient advises wart has improved. ).   The following portions of the chart were reviewed this encounter and updated as appropriate:   Tobacco  Allergies  Meds  Problems  Med Hx  Surg Hx  Fam Hx      Review of Systems:  No other skin or systemic complaints except as noted in HPI or Assessment and Plan.  Objective  Well appearing patient in no apparent distress; mood and affect are within normal limits.  A focused examination was performed including left foot. Relevant physical exam findings are noted in the Assessment and Plan.  left plantar foot Verrucous papules -- Discussed viral etiology and contagion.     Assessment & Plan  Plantar wart left plantar foot  Discussed viral etiology and risk of spread.  Discussed multiple treatments may be required to clear warts.  Discussed possible post-treatment dyspigmentation and risk of recurrence.  Prior to procedure, discussed risks of blister formation, small wound, skin dyspigmentation, or rare scar following cryotherapy. Recommend Vaseline ointment to treated areas while healing.  Squaric Acid 3% applied to warts today. Prior to application reviewed risk of inflammation and irritation.  Cantharidin Plus is a blistering agent that comes from a beetle.  It needs to be washed off in about 4 hours after application.  Although it is painless when applied in office, it may cause symptoms of mild pain and burning several hours later.  Treated areas will swell and turn red, and blisters may form.  Vaseline and a bandaid may be applied until wound has healed.  Once healed, the skin may remain temporarily discolored.  It can take weeks to months for pigmentation to return to normal.  Advised to wash off with soap and water in 4 hours or sooner if it becomes tender  before then.   Destruction of lesion - left plantar foot  Destruction method: cryotherapy   Informed consent: discussed and consent obtained   Lesion destroyed using liquid nitrogen: Yes   Cryotherapy cycles:  2 Outcome: patient tolerated procedure well with no complications   Post-procedure details: wound care instructions given    Destruction of lesion - left plantar foot  Destruction method: chemical removal   Informed consent: discussed and consent obtained   Timeout:  patient name, date of birth, surgical site, and procedure verified Chemical destruction method: cantharidin   Chemical destruction method comment:  Cantharidin plus Application time:  4 hours Procedure instructions: patient instructed to wash and dry area   Outcome: patient tolerated procedure well with no complications   Post-procedure details: wound care instructions given   Additional details:  Patient advised to set alarm to remind them to wash off with soap and water at the directed time.   Return in about 4 weeks (around 01/18/2022) for Warts.  Graciella Belton, RMA, am acting as scribe for [name], MD .  Documentation: I have reviewed the above documentation for accuracy and completeness, and I agree with the above.  [name], MD

## 2021-12-21 NOTE — Patient Instructions (Signed)
Instructions for After In-Office Application of Cantharidin  1. This is a strong medicine; please follow ALL instructions.  2. Gently wash off with soap and water in four hours or sooner s directed by your physician.  3. **WARNING** this medicine can cause severe blistering, blood blisters, infection, and/or scarring if it is not washed off as directed.  4. Your progress will be rechecked in 1-2 months; call sooner if there are any questions or problems.  Cantharidin Plus is a blistering agent that comes from a beetle.  It needs to be washed off in about 4 hours after application.  Although it is painless when applied in office, it may cause symptoms of mild pain and burning several hours later.  Treated areas will swell and turn red, and blisters may form.  Vaseline and a bandaid may be applied until wound has healed.  Once healed, the skin may remain temporarily discolored.  It can take weeks to months for pigmentation to return to normal.  Advised to wash off with soap and water in 4 hours or sooner if it becomes tender before then.  Due to recent changes in healthcare laws, you may see results of your pathology and/or laboratory studies on MyChart before the doctors have had a chance to review them. We understand that in some cases there may be results that are confusing or concerning to you. Please understand that not all results are received at the same time and often the doctors may need to interpret multiple results in order to provide you with the best plan of care or course of treatment. Therefore, we ask that you please give us 2 business days to thoroughly review all your results before contacting the office for clarification. Should we see a critical lab result, you will be contacted sooner.   If You Need Anything After Your Visit  If you have any questions or concerns for your doctor, please call our main line at [phone] and press option 4 to reach your doctor's medical assistant.  If no one answers, please leave a voicemail as directed and we will return your call as soon as possible. Messages left after 4 pm will be answered the following business day.   You may also send us a message via MyChart. We typically respond to MyChart messages within 1-2 business days.  For prescription refills, please ask your pharmacy to contact our office. Our fax number is [phone].  If you have an urgent issue when the clinic is closed that cannot wait until the next business day, you can page your doctor at the number below.    Please note that while we do our best to be available for urgent issues outside of office hours, we are not available 24/7.   If you have an urgent issue and are unable to reach us, you may choose to seek medical care at your doctor's office, retail clinic, urgent care center, or emergency room.  If you have a medical emergency, please immediately call 911 or go to the emergency department.  Pager Numbers  - Dr. Kowalski: [phone]  - Dr. Moye: [phone]  - Dr. Stewart: [phone]  In the event of inclement weather, please call our main line at [phone] for an update on the status of any delays or closures.  Dermatology Medication Tips: Please keep the boxes that topical medications come in in order to help keep track of the instructions about where and how to use these. Pharmacies typically print the medication instructions   only on the boxes and not directly on the medication tubes.   If your medication is too expensive, please contact our office at [phone] option 4 or send [place] a message through [place].   We are unable to tell what your co-pay for medications will be in advance as this is different depending on your insurance coverage. However, we may be able to find a substitute medication at lower cost or fill out paperwork to get insurance to cover a needed medication.   If a prior authorization is required to get your medication  covered by your insurance company, please allow [place] 1-2 business days to complete this process.  Drug prices often vary depending on where the prescription is filled and some pharmacies may offer cheaper prices.  The website www.goodrx.com contains coupons for medications through different pharmacies. The prices here do not account for what the cost may be with help from insurance (it may be cheaper with your insurance), but the website can give you the price if you did not use any insurance.  - You can print the associated coupon and take it with your prescription to the pharmacy.  - You may also stop by our office during regular business hours and pick up a GoodRx coupon card.  - If you need your prescription sent electronically to a different pharmacy, notify our office through [facility] or by phone at [phone] option 4.     Si Usted Necesita Algo Despus de Su Visita  Tambin puede enviarnos un mensaje a travs de Pharmacist, community. Por lo general respondemos a los mensajes de MyChart en el transcurso de 1 a 2 das hbiles.  Para renovar recetas, por favor pida a su farmacia que se ponga en contacto con nuestra oficina. Harland Dingwall de fax es [place] [phone].  Si tiene un asunto urgente cuando la clnica est cerrada y que no puede esperar hasta el siguiente da hbil, puede llamar/localizar a su doctor(a) al nmero que aparece a continuacin.   Por favor, tenga en cuenta que aunque hacemos todo lo posible para estar disponibles para asuntos urgentes fuera del horario de [place], no estamos disponibles las 24 horas del da, los 7 das de la [place].   Si tiene un problema urgente y no puede comunicarse con nosotros, puede optar por buscar atencin mdica  en el consultorio de su doctor(a), en una clnica privada, en un centro de atencin urgente o en una sala de emergencias.  Si tiene Engineering geologist, por favor llame inmediatamente al 911 o vaya a la sala de  emergencias.  Nmeros de bper  - Dr. Nehemiah Massed: [phone]  - Dra. Moye: [phone]  - Dra. Nicole Kindred: [phone]  En caso de inclemencias del [place], por favor llame a Johnsie Kindred principal al [phone] para una actualizacin sobre el [place] de cualquier retraso o cierre.  Consejos para la medicacin en dermatologa: Por favor, guarde las cajas en las que vienen los medicamentos de uso tpico para ayudarle a seguir las instrucciones sobre dnde y cmo usarlos. Las farmacias generalmente imprimen las instrucciones del medicamento slo en las cajas y no directamente en los tubos del [place].   Si su medicamento es muy caro, por favor, pngase en contacto con Zigmund Daniel llamando al [phone] y presione la opcin 4 o envenos un mensaje a travs de Pharmacist, community.   No podemos decirle cul ser su copago por los medicamentos por adelantado ya que esto es diferente dependiendo de la cobertura de su seguro. Sin embargo, es  posible que podamos encontrar un medicamento sustituto a Electrical engineer un formulario para que el seguro cubra el medicamento que se considera necesario.   Si se requiere una autorizacin previa para que su compaa de seguros [place] su medicamento, por favor permtanos de 1 a 2 das hbiles para completar este proceso.  Los precios de los medicamentos varan con frecuencia dependiendo del Environmental consultant de dnde se surte la receta y alguna farmacias pueden ofrecer precios ms baratos.  El sitio web www.goodrx.com tiene cupones para medicamentos de Airline pilot. Los precios aqu no tienen en cuenta lo que podra costar con la ayuda del seguro (puede ser ms barato con su seguro), pero el sitio web puede darle el precio si no utiliz Research scientist (physical sciences).  - Puede imprimir el cupn correspondiente y llevarlo con su receta a la farmacia.  - Tambin puede pasar por nuestra oficina durante el horario de atencin regular y Charity fundraiser una tarjeta de cupones de GoodRx.  - Si  necesita que su receta se enve electrnicamente a una farmacia diferente, informe a nuestra oficina a travs de MyChart de [place] o por telfono llamando al [phone] y presione la opcin 4.

## 2021-12-26 ENCOUNTER — Encounter: Payer: Self-pay | Admitting: Dermatology

## 2022-01-24 ENCOUNTER — Ambulatory Visit: Payer: Medicaid Other | Admitting: Dermatology

## 2022-02-06 ENCOUNTER — Ambulatory Visit (INDEPENDENT_AMBULATORY_CARE_PROVIDER_SITE_OTHER): Payer: Medicaid Other | Admitting: Dermatology

## 2022-02-06 DIAGNOSIS — B070 Plantar wart: Secondary | ICD-10-CM

## 2022-02-06 NOTE — Progress Notes (Signed)
   Follow-Up Visit   Subjective  Erin Krause is a 21 y.o. female who presents for the following: Warts (Patient here today for 4 week follow up at left plantar foot. Wart previously treated with LN2, cantharidin plus and squaric acid. ).  Patient advises improved and she does not even see where wart was.   The following portions of the chart were reviewed this encounter and updated as appropriate:   Tobacco  Allergies  Meds  Problems  Med Hx  Surg Hx  Fam Hx      Review of Systems:  No other skin or systemic complaints except as noted in HPI or Assessment and Plan.  Objective  Well appearing patient in no apparent distress; mood and affect are within normal limits.  A focused examination was performed including feet. Relevant physical exam findings are noted in the Assessment and Plan.  Left Plantar Foot Clear     Assessment & Plan  Plantar wart Left Plantar Foot  Observe for recurrence    Return if symptoms worsen or fail to improve.  Graciella Belton, RMA, am acting as scribe for [name], MD .  Documentation: I have reviewed the above documentation for accuracy and completeness, and I agree with the above.  [name], MD

## 2022-02-06 NOTE — Patient Instructions (Signed)
Due to recent changes in healthcare laws, you may see results of your pathology and/or laboratory studies on MyChart before the doctors have had a chance to review them. We understand that in some cases there may be results that are confusing or concerning to you. Please understand that not all results are received at the same time and often the doctors may need to interpret multiple results in order to provide you with the best plan of care or course of treatment. Therefore, we ask that you please give us 2 business days to thoroughly review all your results before contacting the office for clarification. Should we see a critical lab result, you will be contacted sooner.   If You Need Anything After Your Visit  If you have any questions or concerns for your doctor, please call our main line at [phone] and press option 4 to reach your doctor's medical assistant. If no one answers, please leave a voicemail as directed and we will return your call as soon as possible. Messages left after 4 pm will be answered the following business day.   You may also send us a message via MyChart. We typically respond to MyChart messages within 1-2 business days.  For prescription refills, please ask your pharmacy to contact our office. Our fax number is [phone].  If you have an urgent issue when the clinic is closed that cannot wait until the next business day, you can page your doctor at the number below.    Please note that while we do our best to be available for urgent issues outside of office hours, we are not available 24/7.   If you have an urgent issue and are unable to reach us, you may choose to seek medical care at your doctor's office, retail clinic, urgent care center, or emergency room.  If you have a medical emergency, please immediately call 911 or go to the emergency department.  Pager Numbers  - Dr. Kowalski: [phone]  - Dr. Moye: [phone]  - Dr. Stewart:  [phone]  In the event of inclement weather, please call our main line at [phone] for an update on the status of any delays or closures.  Dermatology Medication Tips: Please keep the boxes that topical medications come in in order to help keep track of the instructions about where and how to use these. Pharmacies typically print the medication instructions only on the boxes and not directly on the medication tubes.   If your medication is too expensive, please contact our office at [phone] option 4 or send us a message through MyChart.   We are unable to tell what your co-pay for medications will be in advance as this is different depending on your insurance coverage. However, we may be able to find a substitute medication at lower cost or fill out paperwork to get insurance to cover a needed medication.   If a prior authorization is required to get your medication covered by your insurance company, please allow us 1-2 business days to complete this process.  Drug prices often vary depending on where the prescription is filled and some pharmacies may offer cheaper prices.  The website www.goodrx.com contains coupons for medications through different pharmacies. The prices here do not account for what the cost may be with help from insurance (it may be cheaper with your insurance), but the website can give you the price if you did not use any insurance.  - You can print the associated coupon and take it with   your prescription to the pharmacy.  - You may also stop by our office during regular business hours and pick up a GoodRx coupon card.  - If you need your prescription sent electronically to a different pharmacy, notify our office through [place] MyChart or by phone at [phone] option 4.     Si Usted Necesita Algo Despus de Su Visita  Tambin puede enviarnos un mensaje a travs de MyChart. Por lo general respondemos a los mensajes de MyChart en el transcurso de 1 a 2  das hbiles.  Para renovar recetas, por favor pida a su farmacia que se ponga en contacto con nuestra oficina. Nuestro nmero de fax es el [phone].  Si tiene un asunto urgente cuando la clnica est cerrada y que no puede esperar hasta el siguiente da hbil, puede llamar/localizar a su doctor(a) al nmero que aparece a continuacin.   Por favor, tenga en cuenta que aunque hacemos todo lo posible para estar disponibles para asuntos urgentes fuera del horario de oficina, no estamos disponibles las 24 horas del da, los 7 das de la semana.   Si tiene un problema urgente y no puede comunicarse con nosotros, puede optar por buscar atencin mdica  en el consultorio de su doctor(a), en una clnica privada, en un centro de atencin urgente o en una sala de emergencias.  Si tiene una emergencia mdica, por favor llame inmediatamente al 911 o vaya a la sala de emergencias.  Nmeros de bper  - Dr. Kowalski: [phone]  - Dra. Moye: [phone]  - Dra. Stewart: [phone]  En caso de inclemencias del tiempo, por favor llame a nuestra lnea principal al [phone] para una actualizacin sobre el estado de cualquier retraso o cierre.  Consejos para la medicacin en dermatologa: Por favor, guarde las cajas en las que vienen los medicamentos de uso tpico para ayudarle a seguir las instrucciones sobre dnde y cmo usarlos. Las farmacias generalmente imprimen las instrucciones del medicamento slo en las cajas y no directamente en los tubos del medicamento.   Si su medicamento es muy caro, por favor, pngase en contacto con nuestra oficina llamando al [phone] y presione la opcin 4 o envenos un mensaje a travs de MyChart.   No podemos decirle cul ser su copago por los medicamentos por adelantado ya que esto es diferente dependiendo de la cobertura de su seguro. Sin embargo, es posible que podamos encontrar un medicamento sustituto a menor costo o llenar un formulario para que el  seguro cubra el medicamento que se considera necesario.   Si se requiere una autorizacin previa para que su compaa de seguros cubra su medicamento, por favor permtanos de 1 a 2 das hbiles para completar este proceso.  Los precios de los medicamentos varan con frecuencia dependiendo del lugar de dnde se surte la receta y alguna farmacias pueden ofrecer precios ms baratos.  El sitio web www.goodrx.com tiene cupones para medicamentos de diferentes farmacias. Los precios aqu no tienen en cuenta lo que podra costar con la ayuda del seguro (puede ser ms barato con su seguro), pero el sitio web puede darle el precio si no utiliz ningn seguro.  - Puede imprimir el cupn correspondiente y llevarlo con su receta a la farmacia.  - Tambin puede pasar por nuestra oficina durante el horario de atencin regular y recoger una tarjeta de cupones de GoodRx.  - Si necesita que su receta se enve electrnicamente a una farmacia diferente, informe a nuestra oficina a travs de MyChart de [place]   o por telfono llamando al [phone] y presione la opcin 4.  

## 2022-02-10 ENCOUNTER — Encounter: Payer: Self-pay | Admitting: Dermatology

## 2022-03-02 ENCOUNTER — Ambulatory Visit (INDEPENDENT_AMBULATORY_CARE_PROVIDER_SITE_OTHER): Payer: Medicaid Other | Admitting: Family Medicine

## 2022-03-02 ENCOUNTER — Ambulatory Visit: Payer: Medicaid Other

## 2022-03-02 ENCOUNTER — Encounter: Payer: Self-pay | Admitting: Family Medicine

## 2022-03-02 VITALS — BP 114/60 | HR 81 | Temp 97.9°F | Ht 63.0 in | Wt 116.4 lb

## 2022-03-02 DIAGNOSIS — N926 Irregular menstruation, unspecified: Secondary | ICD-10-CM

## 2022-03-02 DIAGNOSIS — Z3009 Encounter for other general counseling and advice on contraception: Secondary | ICD-10-CM

## 2022-03-02 MED ORDER — CRYSELLE-28 0.3-30 MG-MCG PO TABS: 1.0000 | ORAL_TABLET | Freq: Every day | ORAL | 3 refills | Status: DC

## 2022-03-02 NOTE — Patient Instructions (Addendum)
Follow up as needed or as scheduled START your pill pack on Sunday Take 1 pill daily as directed Call with any questions or concerns Stay Safe!  Stay Healthy! Happy Holidays!!!

## 2022-03-02 NOTE — Progress Notes (Signed)
   Subjective:    Patient ID: Erin Krause, female    DOB: Apr 17, 2000, 21 y.o.   MRN: [id number]  HPI Contraception- pt is currently on Depo but would prefer to take the pill instead.  'i don't do good w/ shots'.  Also doesn't like absence of periods.  Today was supposed to be her repeat Depo injection but pt doesn't want to continue.  Had vagal episode w/ last injxn.  Pt has been on pill previously.   Review of Systems For ROS see HPI     Objective:   Physical Exam Vitals reviewed.  Constitutional:      General: She is not in acute distress.    Appearance: Normal appearance. She is not ill-appearing.  HENT:     Head: Normocephalic and atraumatic.  Eyes:     Extraocular Movements: Extraocular movements intact.     Conjunctiva/sclera: Conjunctivae normal.     Pupils: Pupils are equal, round, and reactive to light.  Cardiovascular:     Rate and Rhythm: Normal rate and regular rhythm.  Pulmonary:     Effort: Pulmonary effort is normal. No respiratory distress.  Skin:    General: Skin is warm and dry.  Neurological:     General: No focal deficit present.     Mental Status: She is alert and oriented to person, place, and time.  Psychiatric:        Mood and Affect: Mood normal.        Behavior: Behavior normal.        Thought Content: Thought content normal.           Assessment & Plan:  Contraception management- after pt's Depo injxn in September she had a vagal episode.  In thinking about it, she would prefer to avoid shots going forward and would rather restart the pill.  Discussed appropriate start date, possible side effects, and need to take daily.  Pt expressed understanding and is in agreement w/ plan.

## 2022-03-06 ENCOUNTER — Telehealth: Payer: Self-pay | Admitting: Family Medicine

## 2022-03-06 NOTE — Telephone Encounter (Signed)
It would be unlikely that the birth control pill would cause this.... but I won't say impossible.  It sounds more like an anxiety episode/attack.  I would take the pill as scheduled tonight and see if she has similar sxs.  If she does, she needs to let us know so we can make changes.

## 2022-03-06 NOTE — Telephone Encounter (Signed)
Spoke to mom she said she would have Erin Krause call us back

## 2022-03-06 NOTE — Telephone Encounter (Signed)
Called and discussed this with the patient she will call back tomorrow to give an update on this

## 2022-03-06 NOTE — Telephone Encounter (Signed)
Patient call back to speak with Erin Krause. Patient want Erin Krause to call her back on cell phone. [phone]

## 2022-03-06 NOTE — Telephone Encounter (Signed)
Patient called in stating that she started her birth control pill last night and about 3 hours after she took it (12/1028) she woke up to her heart beating extremely fast and her body trembling. She didn't know if this could be a side effect from the pill but she was worried. I let her know that I would send this to Dr Birdie Riddle and see what she advises.

## 2022-03-13 ENCOUNTER — Ambulatory Visit (INDEPENDENT_AMBULATORY_CARE_PROVIDER_SITE_OTHER): Payer: Medicaid Other | Admitting: Family Medicine

## 2022-03-13 ENCOUNTER — Encounter: Payer: Self-pay | Admitting: Family Medicine

## 2022-03-13 VITALS — BP 100/68 | HR 83 | Temp 98.1°F | Resp 17 | Ht 63.0 in | Wt 114.4 lb

## 2022-03-13 DIAGNOSIS — R002 Palpitations: Secondary | ICD-10-CM | POA: Diagnosis not present

## 2022-03-13 DIAGNOSIS — F419 Anxiety disorder, unspecified: Secondary | ICD-10-CM | POA: Diagnosis not present

## 2022-03-13 MED ORDER — FLUOXETINE HCL 10 MG PO CAPS: 10.0000 mg | ORAL_CAPSULE | Freq: Every day | ORAL | 3 refills | Status: DC
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
## 2022-03-15 ENCOUNTER — Ambulatory Visit: Payer: Medicaid Other | Admitting: Family Medicine

## 2022-03-19 ENCOUNTER — Ambulatory Visit: Payer: Medicaid Other | Admitting: Family Medicine

## 2022-03-19 ENCOUNTER — Telehealth: Payer: Self-pay | Admitting: Family Medicine

## 2022-03-19 NOTE — Telephone Encounter (Signed)
Mom called stating that her daughter is feeling much better. Patient doesn't need appt anymore.

## 2022-03-19 NOTE — Telephone Encounter (Signed)
Initial Comment Caller states her daughter has shortness of breath. She looks pale and is feeling weak. Translation No Nurse Assessment Nurse: Eugenio Hoes, RN, Jenny Reichmann Date/Time (Eastern Time): 03/16/2022 3:06:11 PM Confirm and document reason for call. If symptomatic, describe symptoms. ---Caller states that she started to have side pain and chest pain today. Denies lightheadedness or shortness of breath. Denies side pain now. Caller states that pain to chest is on the left side. Caller states she feels chest tightness. Does the patient have any new or worsening symptoms? ---Yes Will a triage be completed? ---Yes Related visit to physician within the last 2 weeks? ---Yes Does the PT have any chronic conditions? (i.e. diabetes, asthma, this includes High risk factors for pregnancy, etc.) ---No Is the patient pregnant or possibly pregnant? (Ask all females between the ages of 52-55) ---No Is this a behavioral health or substance abuse call? ---No Guidelines Guideline Title Affirmed Question Affirmed Notes Nurse Date/Time (Eastern Time) Chest Pain [1] Chest pain (or "angina") comes and goes AND [2] is happening more Lynett Fish 03/16/2022 3:09:26 PM PLEASE NOTE: All timestamps contained within this report are represented as [place] Standard Time. CONFIDENTIALTY NOTICE: This fax transmission is intended only for the addressee. It contains information that is legally privileged, confidential or otherwise protected from use or disclosure. If you are not the intended recipient, you are strictly prohibited from reviewing, disclosing, copying using or disseminating any of this information or taking any action in reliance on or regarding this information. If you have received this fax in error, please notify us immediately by telephone so that we can arrange for its return to [place]. Phone: [phone], Toll-Free: [phone], Fax: [phone] Page: 2 of 2 Call Id:  [id number] Guidelines Guideline Title Affirmed Question Affirmed Notes Nurse Date/Time Eilene Ghazi Time) often (increasing in frequency) or getting worse (increasing in severity) (Exception: Chest pains that last only a few seconds.) Disp. Time Eilene Ghazi Time) Disposition Final User 03/16/2022 3:04:10 PM Send to Urgent Queue Olga Coaster 03/16/2022 3:13:56 PM Go to ED Now Yes Eugenio Hoes, RN, Jenny Reichmann Final Disposition 03/16/2022 3:13:56 PM Go to ED Now Yes Eugenio Hoes, RN, [place] Disagree/Comply Comply Caller Understands Yes PreDisposition Did not know what to do Care Advice Given Per Guideline GO TO ED NOW: * Leave now. Drive carefully. CARE ADVICE given per Chest Pain (Adult) guideline. Referrals GO TO FACILITY UNDECIDED  Patient has an appt with Dr.Tabori this morning

## 2022-04-04 ENCOUNTER — Other Ambulatory Visit: Payer: Self-pay | Admitting: Family Medicine

## 2022-04-09 ENCOUNTER — Ambulatory Visit: Payer: Medicaid Other | Admitting: Family Medicine

## 2022-10-26 ENCOUNTER — Encounter: Payer: Self-pay | Admitting: Family Medicine

## 2022-10-26 ENCOUNTER — Ambulatory Visit (INDEPENDENT_AMBULATORY_CARE_PROVIDER_SITE_OTHER): Payer: BLUE CROSS/BLUE SHIELD | Admitting: Family Medicine

## 2022-10-26 VITALS — BP 104/60 | HR 60 | Temp 98.1°F | Ht 63.5 in | Wt 116.4 lb

## 2022-10-26 DIAGNOSIS — D171 Benign lipomatous neoplasm of skin and subcutaneous tissue of trunk: Secondary | ICD-10-CM | POA: Diagnosis not present

## 2022-10-26 DIAGNOSIS — N926 Irregular menstruation, unspecified: Secondary | ICD-10-CM

## 2022-10-26 DIAGNOSIS — Z3009 Encounter for other general counseling and advice on contraception: Secondary | ICD-10-CM

## 2022-10-26 MED ORDER — CRYSELLE-28 0.3-30 MG-MCG PO TABS: 1.0000 | ORAL_TABLET | Freq: Every day | ORAL | 3 refills | Status: AC

## 2022-10-26 NOTE — Patient Instructions (Signed)
Follow up as needed or as scheduled We'll call you to schedule the surgery appt START the pill on Sunday to help regulate your cycles Call with any questions or concerns Stay Safe!  Stay Healthy! Enjoy the rest of your summer!!!

## 2022-10-26 NOTE — Progress Notes (Signed)
   Subjective:    Patient ID: Erin Krause, female    DOB: 2000-10-02, 22 y.o.   MRN: [id number]  HPI Skin concern- pt reports she had lipoma on her back removed a few years ago.  Feels it has returned.  Not painful.  Not draining.    Contraception concerns- pt stopped taking all medications.  Was previously on Depo.  Then was on OCPs for 1 month before stopping.  Had period on 7/14 that lasted 4 days.  Then on 7/24 again started bleeding like a regular period.     Review of Systems For ROS see HPI     Objective:   Physical Exam Vitals reviewed.  Constitutional:      General: She is not in acute distress.    Appearance: Normal appearance. She is not ill-appearing.  HENT:     Head: Normocephalic and atraumatic.  Skin:    General: Skin is warm and dry.     Comments: Small, mobile soft tissue mass of L posterior shoulder just above spine of scapula.  Non tender, not fluctuant.  Neurological:     General: No focal deficit present.     Mental Status: She is alert and oriented to person, place, and time.  Psychiatric:        Mood and Affect: Mood normal.        Behavior: Behavior normal.        Thought Content: Thought content normal.           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  She had this removed previously but it has come back.  She saw a Careers adviser at [facility] in 2022 that recommended watchful waiting rather than repeat excision.  Today pt feels that it may be enlarging.  Will refer to surgery for evaluation.  Pt expressed understanding and is in agreement w/ plan.   Irregular menses- new.  Pt stopped her Depo 3 months ago.  She then took OCPs for 1 month and still did not have a period.  This month she has had 2 periods back to back.  I reassured her that with all the hormonal changes, this is not unusual.  She is willing to restart the pill if this will normalize her cycles.  Contraception- pt is sexually active and is now not  on Depo or OCPs.  She does not desire pregnancy.  Based on her irregular cycles and her need for birth control, will restart the pill daily.  Reviewed the need to use condoms for at least 2 months to prevent pregnancy and at each sexual encounter to prevent STIs.  Pt expressed understanding and is in agreement w/ plan.
